# Patient Record
Sex: Male | Born: 1970 | Race: White | Hispanic: No | Marital: Single | Smoking: Current every day smoker
Health system: Southern US, Community
[De-identification: ages and names within clinical notes are randomized; demographics above are authoritative.]

## PROBLEM LIST (undated history)

## (undated) DIAGNOSIS — B1920 Unspecified viral hepatitis C without hepatic coma: Secondary | ICD-10-CM

---

## 2020-02-13 ENCOUNTER — Other Ambulatory Visit: Payer: Self-pay

## 2020-02-13 ENCOUNTER — Emergency Department (HOSPITAL_COMMUNITY)
Admission: EM | Admit: 2020-02-13 | Discharge: 2020-02-13 | Disposition: A | Discharge status: Home / Self Care | Payer: Self-pay | Attending: Emergency Medicine | Admitting: Emergency Medicine

## 2020-02-13 ENCOUNTER — Emergency Department (HOSPITAL_COMMUNITY): Payer: Self-pay

## 2020-02-13 ENCOUNTER — Encounter (HOSPITAL_COMMUNITY): Payer: Self-pay

## 2020-02-13 DIAGNOSIS — F17210 Nicotine dependence, cigarettes, uncomplicated: Secondary | ICD-10-CM | POA: Insufficient documentation

## 2020-02-13 DIAGNOSIS — L03114 Cellulitis of left upper limb: Secondary | ICD-10-CM | POA: Insufficient documentation

## 2020-02-13 DIAGNOSIS — M7989 Other specified soft tissue disorders: Secondary | ICD-10-CM

## 2020-02-13 DIAGNOSIS — R238 Other skin changes: Secondary | ICD-10-CM

## 2020-02-13 HISTORY — DX: Unspecified viral hepatitis C without hepatic coma: B19.20

## 2020-02-13 LAB — LACTIC ACID, PLASMA
Lactic Acid, Venous: 1.3 mmol/L (ref 0.5–1.9)
Lactic Acid, Venous: 1.1 mmol/L (ref 0.5–1.9)

## 2020-02-13 LAB — CBC WITH DIFFERENTIAL/PLATELET
Abs Immature Granulocytes: 0.07 10*3/uL (ref 0.00–0.07)
Basophils Absolute: 0.1 10*3/uL (ref 0.0–0.1)
Basophils Relative: 1 %
Eosinophils Absolute: 0.3 10*3/uL (ref 0.0–0.5)
Eosinophils Relative: 3 %
HCT: 37.6 % — ABNORMAL LOW (ref 39.0–52.0)
Hemoglobin: 12.5 g/dL — ABNORMAL LOW (ref 13.0–17.0)
Immature Granulocytes: 1 %
Lymphocytes Relative: 18 %
Lymphs Abs: 1.8 10*3/uL (ref 0.7–4.0)
MCH: 29.9 pg (ref 26.0–34.0)
MCHC: 33.2 g/dL (ref 30.0–36.0)
MCV: 90 fL (ref 80.0–100.0)
Monocytes Absolute: 1.1 10*3/uL — ABNORMAL HIGH (ref 0.1–1.0)
Monocytes Relative: 11 %
Neutro Abs: 6.8 10*3/uL (ref 1.7–7.7)
Neutrophils Relative %: 66 %
Platelets: 400 10*3/uL (ref 150–400)
RBC: 4.18 MIL/uL — ABNORMAL LOW (ref 4.22–5.81)
RDW: 13.5 % (ref 11.5–15.5)
WBC: 10.1 10*3/uL (ref 4.0–10.5)
nRBC: 0 % (ref 0.0–0.2)

## 2020-02-13 LAB — BASIC METABOLIC PANEL
Anion gap: 8 (ref 5–15)
BUN: 20 mg/dL (ref 6–20)
CO2: 24 mmol/L (ref 22–32)
Calcium: 8.9 mg/dL (ref 8.9–10.3)
Chloride: 103 mmol/L (ref 98–111)
Creatinine, Ser: 1.04 mg/dL (ref 0.61–1.24)
GFR, Estimated: >60 mL/min (ref >60)
Glucose, Bld: 106 mg/dL — ABNORMAL HIGH (ref 70–99)
Potassium: 4 mmol/L (ref 3.5–5.1)
Sodium: 135 mmol/L (ref 135–145)

## 2020-02-13 MED ORDER — IBUPROFEN 800 MG PO TABS
800.0000 mg | ORAL_TABLET | Freq: Once | ORAL | Status: AC
  Administered 2020-02-13: 800 mg via ORAL
  Filled 2020-02-13: qty 1

## 2020-02-13 MED ORDER — SULFAMETHOXAZOLE-TRIMETHOPRIM 800-160 MG PO TABS: 1.0000 | ORAL_TABLET | Freq: Two times a day (BID) | ORAL | 0 refills | Status: AC

## 2020-02-13 MED ORDER — SODIUM CHLORIDE 0.9 % IV BOLUS
500.0000 mL | Freq: Once | INTRAVENOUS | Status: AC
  Administered 2020-02-13: 500 mL via INTRAVENOUS

## 2020-02-13 NOTE — ED Provider Notes (Signed)
[facility] EMERGENCY DEPARTMENT Provider Note   CSN: [id number] Arrival date & time: 02/13/20  8916     History Chief Complaint  Patient presents with  . Skin Problem    Michael Copeland is a 49 y.o. male.  HPI   49 year old male with past medical history of Hep C in remission presents with LUE swelling and redness. Patient states over two months ago he had an injury and break in the skin on the left forearm from his dogs leash. He had been applying neosporin, but found a "discolored clump" in the tube that he believes has caused a fungal infection on his arm. Patient has been trying multiple different topical remedies daily with worsening condition. Over the last week he states the swelling and redness has become worse and it is more painful with raw skin. Denies any other injury to the arm, no numbness. Denies fever, CP, SOB, n/v/d or other acute illness. He does mention swelling in his BLE, left worse than right. He is a [employer] and sits for multiple hours daily. No history of DVT/PE.  Past Medical History:  Diagnosis Date  . Hepatitis C     There are no problems to display for this patient.   History reviewed. No pertinent surgical history.     History reviewed. No pertinent family history.  Social History   Tobacco Use  . Smoking status: Current Every Day Smoker    Packs/day: 0.50    Types: Cigarettes  . Smokeless tobacco: Never Used  Substance Use Topics  . Alcohol use: Not Currently  . Drug use: Not Currently    Home Medications Prior to Admission medications   Not on File    Allergies    Patient has no known allergies.  Review of Systems   Review of Systems  Constitutional: Positive for fatigue. Negative for chills and fever.  Eyes: Negative for visual disturbance.  Respiratory: Negative for cough and shortness of breath.   Cardiovascular: Negative for chest pain.  Gastrointestinal: Negative for abdominal distention, abdominal pain, diarrhea,  nausea and vomiting.  Genitourinary: Negative for dysuria.  Musculoskeletal:       +left forearm swelling and wounds, +BLE edema left worse than right  Skin: Positive for color change, rash and wound.  Neurological: Negative for headaches.    Physical Exam Updated Vital Signs BP 136/85   Pulse (!) 107   Temp 98.5 F (36.9 C)   Resp 18   Ht 6\' 2"  (1.88 m)   Wt 106.6 kg   SpO2 97%   BMI 30.17 kg/m   Physical Exam Vitals and nursing note reviewed.  Constitutional:      General: He is not in acute distress. HENT:     Head: Normocephalic.  Eyes:     General: No scleral icterus. Cardiovascular:     Rate and Rhythm: Tachycardia present.  Pulmonary:     Effort: Pulmonary effort is normal. No respiratory distress.     Breath sounds: Normal breath sounds.  Abdominal:     Palpations: Abdomen is soft.     Tenderness: There is no abdominal tenderness.  Musculoskeletal:     Right lower leg: Edema present.     Left lower leg: Edema present.     Comments: Left forearm is circumferentially swollen, erythematous with areas of raw open skin and clear weeping, hand is edematous, +radial pulse and brisk capillary refill, fist making hindered by swelling but no noted weakness or deficit. Upper arm and bicep  do not appear swollen, normal ROM of shoulder and no axillary tenderness  Skin:    General: Skin is warm.  Neurological:     Mental Status: He is alert and oriented to person, place, and time. Mental status is at baseline.  Psychiatric:        Mood and Affect: Mood normal.     ED Results / Procedures / Treatments   Labs (all labs ordered are listed, but only abnormal results are displayed) Labs Reviewed  CBC WITH DIFFERENTIAL/PLATELET  BASIC METABOLIC PANEL  LACTIC ACID, PLASMA  LACTIC ACID, PLASMA    EKG None  Radiology No results found.  Procedures Procedures (including critical care time)  Medications Ordered in ED Medications  sodium chloride 0.9 % bolus 500 mL  (has no administration in time range)  ibuprofen (ADVIL) tablet 800 mg (has no administration in time range)    ED Course  I have reviewed the triage vital signs and the nursing notes.  Pertinent labs & imaging results that were available during my care of the patient were reviewed by me and considered in my medical decision making (see chart for details).  Clinical Course as of Feb 13 1120  Thu Feb 13, 2020  7425 EKG is NSR, normal intervals and no acute ischemic changes. Blood work shows no significant leukocytosis or shift, lactic is normal, chemistry unremarkable.    [KH]  1120 US reveal no DVT in upper arm or BLE   [KH]    Clinical Course User Index [KH] Antionette Luster, Clabe Seal, DO   MDM Rules/Calculators/A&P                          Patient tachycardic on arrival, low 100s. No CP or SOB, afebrile. Sitting up and non toxic appearing. Left forearm is red, edematous with open irritated skin, concerning for infection. BLE swollen with 2+ pitting edema, will [place] to r/o DVT given LUE PE and h/o long periods of sitting. Blood work pending.  Blood work is reassuring, no signs of sepsis, [place] r/o DVT in extremities. HR and VS normal, patient resting comfortably. Plan to DC with oral antibiotics and wound care instructions. Also discussed not placing any further oils/lotions or topicals on the arm. Patient verbalized understanding and agrees with DC. Final Clinical Impression(s) / ED Diagnoses Final diagnoses:  None    Rx / DC Orders ED Discharge Orders    None       Rozelle Logan, DO 02/13/20 1121

## 2020-02-13 NOTE — ED Triage Notes (Signed)
Pt presents to ED with complaints of left arm swelling, dry and redness. Pt c/o pain to that arm, states been going on for months. Pt states he has been using various topical ointments, Lamisil, tea tree oil, lavender etc.

## 2020-02-13 NOTE — Discharge Instructions (Addendum)
You have been prescribed antibiotics for left arm infection. Take these as directed. Keep the arm clean and wrapped for the next 24 hours. Then keep the arm clean and dry. You may shower, pat the arm to dry. DO NOT place any more topical creams/lotions on the arm. If you have worsening symptoms, the redness or swelling spreads, high fevers, further concerns for your health please return to an ER for evaluation promptly.

## 2021-11-25 IMAGING — US US EXTREM LOW VENOUS
1 series · 14 of 24 positions shown · non-contrast
Comparison: None.

CLINICAL DATA: Redness and swelling of the lower extremities.
Symptoms for 2 months.

EXAM:
BILATERAL LOWER EXTREMITY VENOUS DOPPLER ULTRASOUND
TECHNIQUE: Gray-scale sonography with compression, as well as color and duplex
ultrasound, were performed to evaluate the deep venous system(s)
from the level of the common femoral vein through the popliteal and
proximal calf veins.

[Series 1: us venous img lower bilat (dvt) · portal-venous · 14 of 61 slices shown]
[im 1/61]
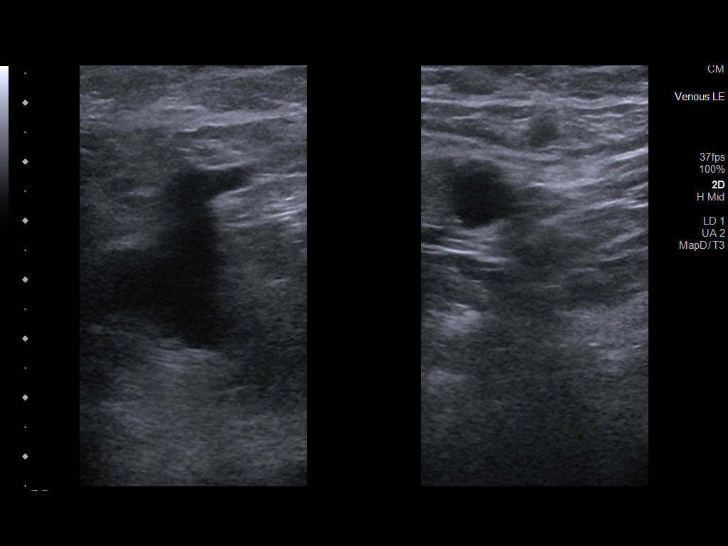
[im 6/61]
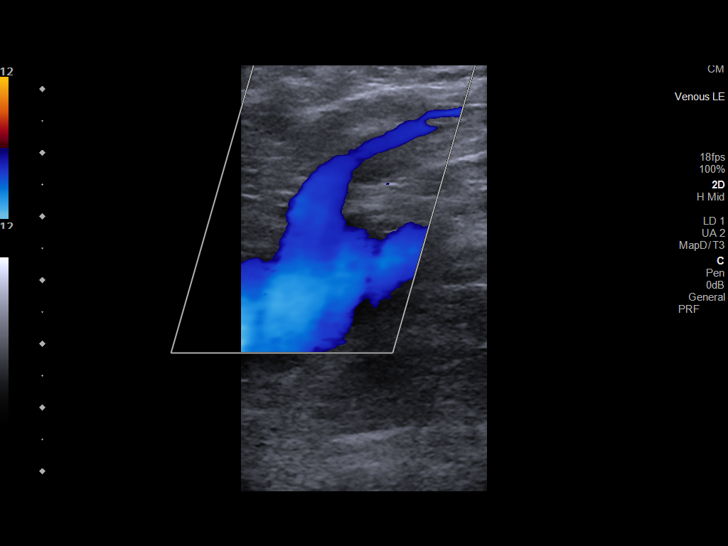
[im 11/61]
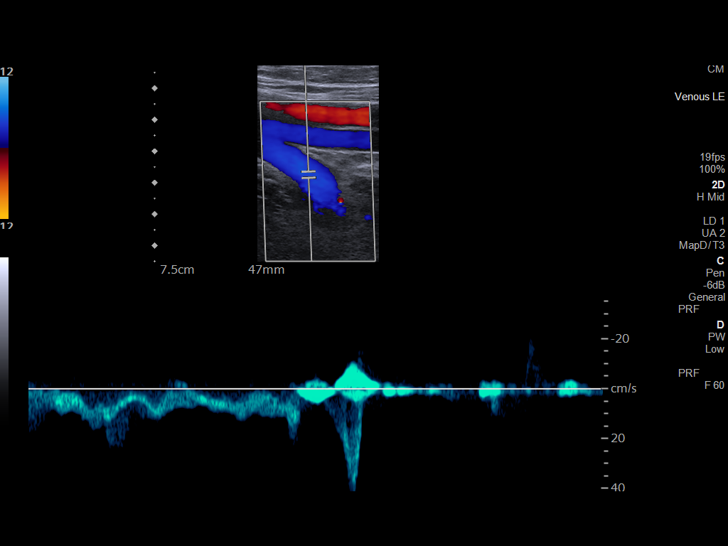
[im 16/61]
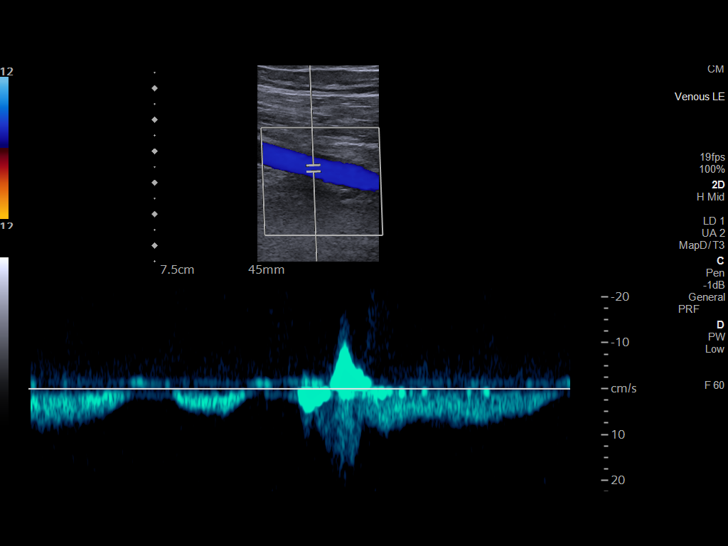
[im 19/61]
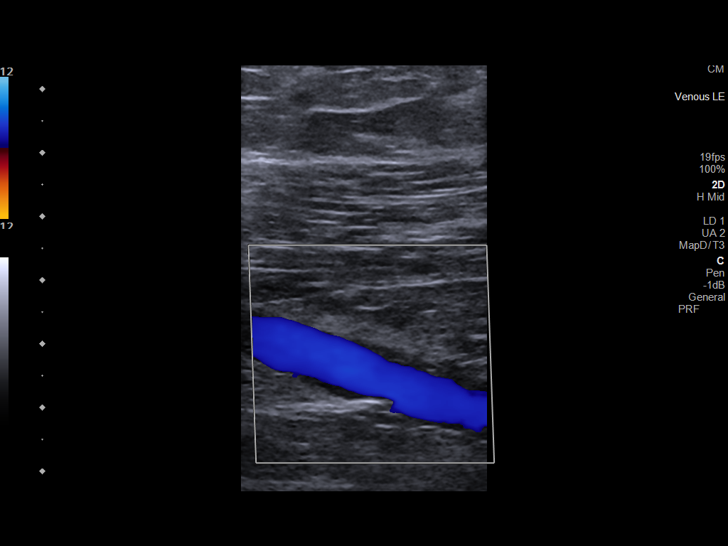
[im 24/61]
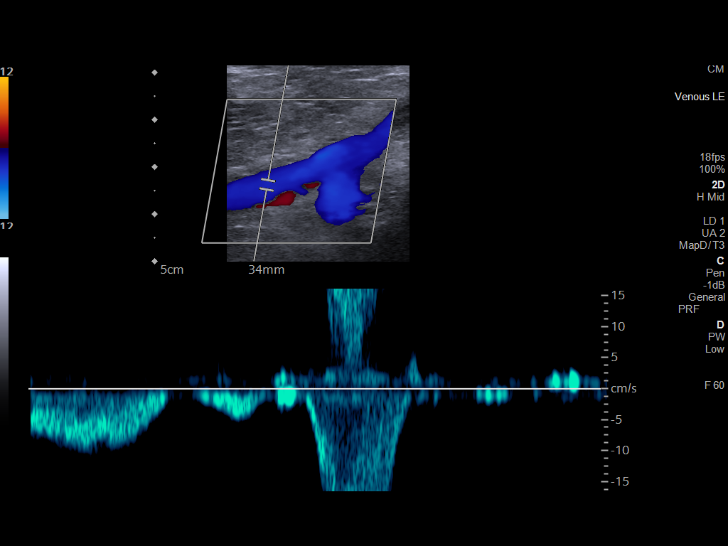
[im 29/61]
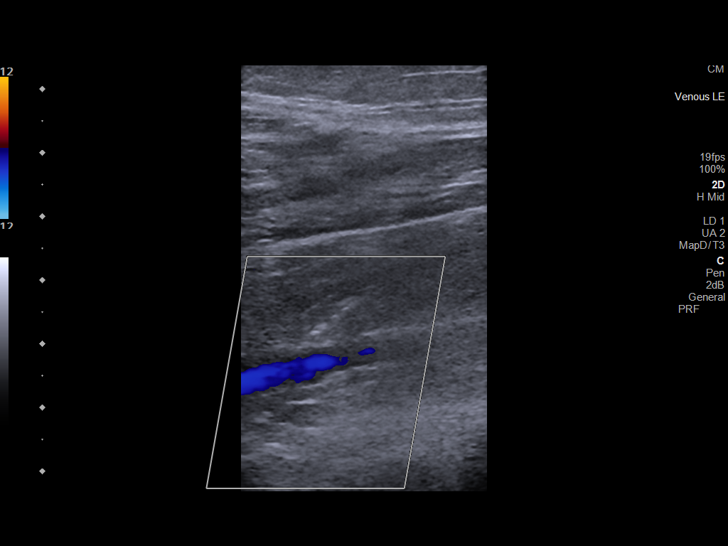
[im 32/61]
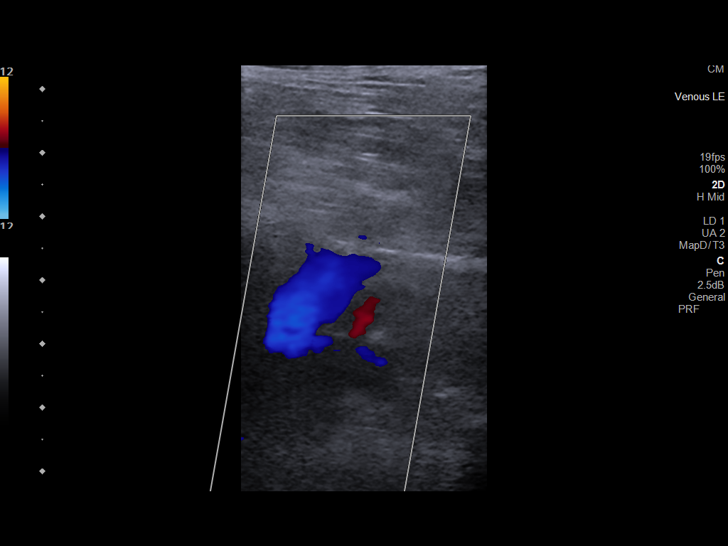
[im 37/61]
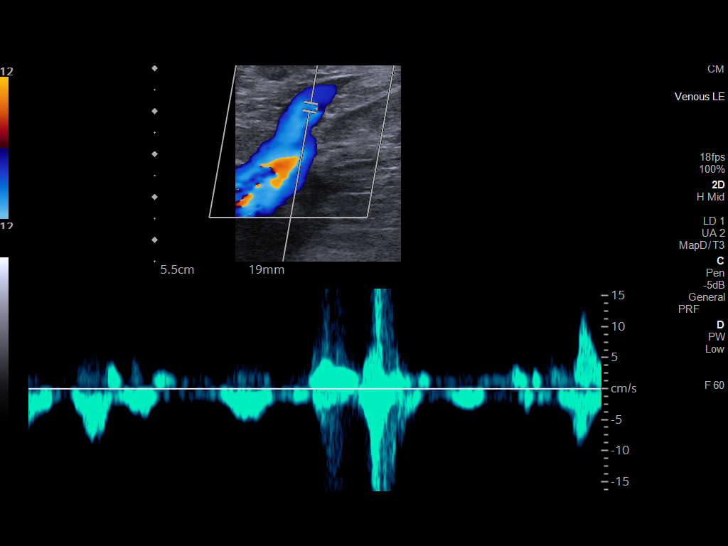
[im 42/61]
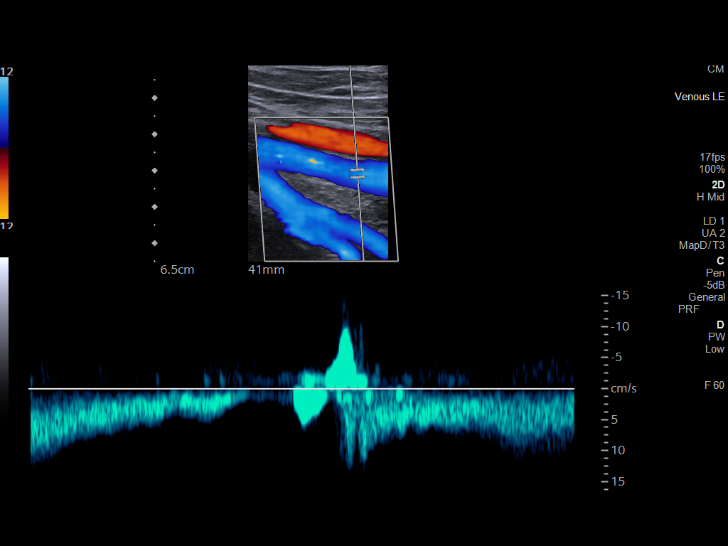
[im 47/61]
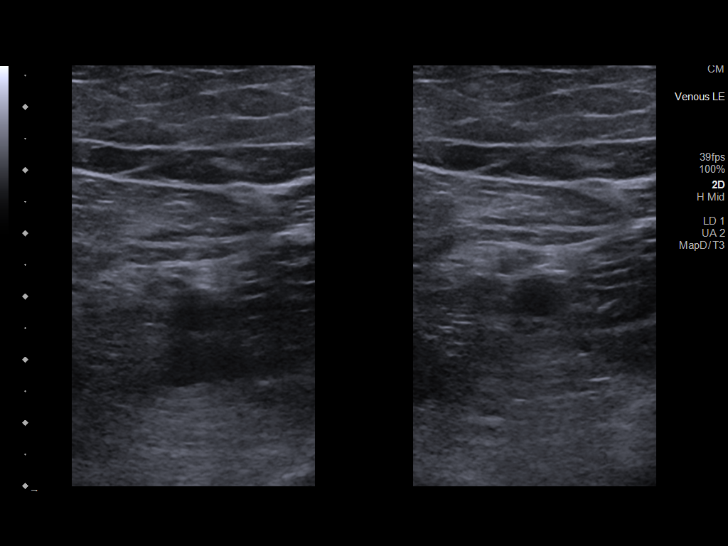
[im 50/61]
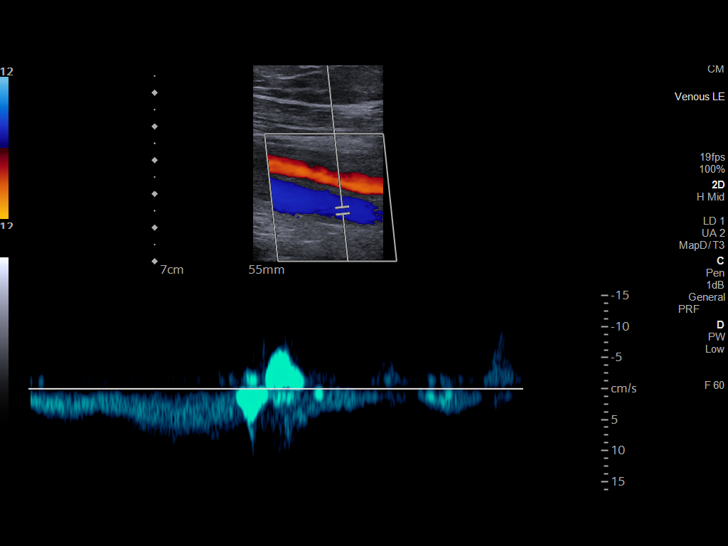
[im 55/61]
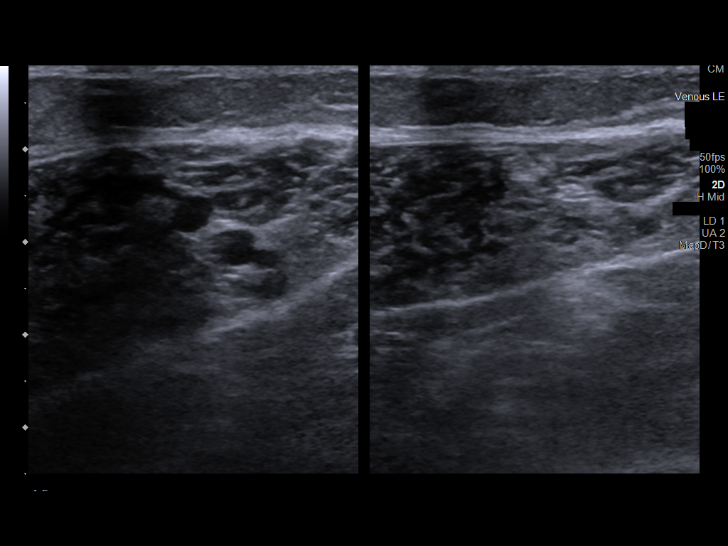
[im 61/61]
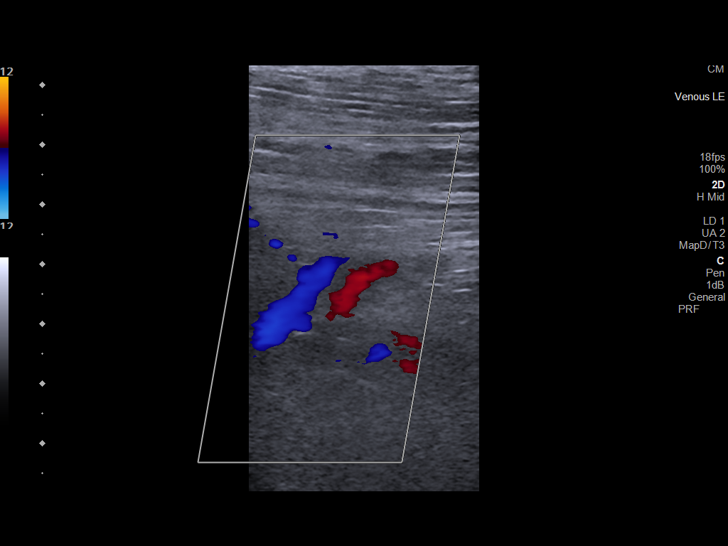

[14 of 24 positions shown; findings below may reference images not displayed]

FINDINGS: VENOUS

Normal compressibility of the common femoral, superficial femoral,
and popliteal veins, as well as the visualized calf veins.
Visualized portions of profunda femoral vein and great saphenous
vein unremarkable. No filling defects to suggest DVT on grayscale or
color Doppler imaging. Doppler waveforms show normal direction of
venous flow, normal respiratory plasticity and response to
augmentation.

Limited views of the contralateral common femoral vein are
unremarkable.

OTHER

None.

Limitations: none
IMPRESSION: Negative.

## 2021-11-25 IMAGING — US US EXTREM  UP VENOUS*L*
1 series · 13 of 24 positions shown · non-contrast
Comparison: None.

CLINICAL DATA: Left upper extremity pain and edema. History of
smoking. Evaluate for DVT.



[Series 1: us venous img upper uni left (dvt) · portal-venous · 13 of 45 slices shown]
[im 1/45]
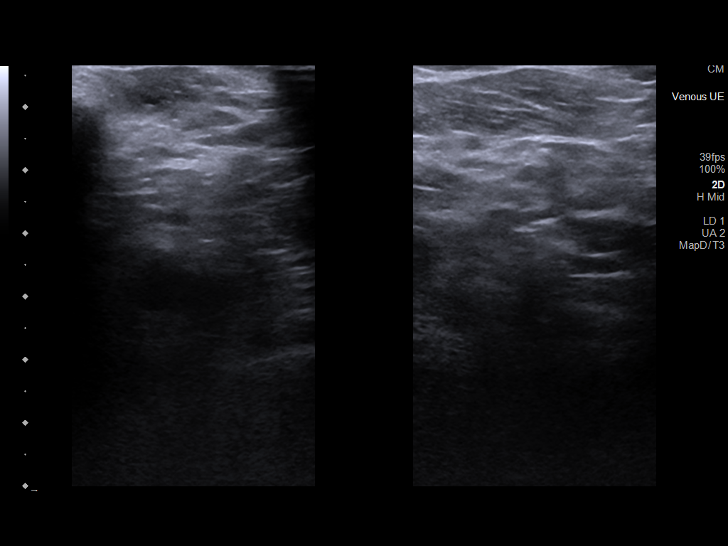
[im 4/45]
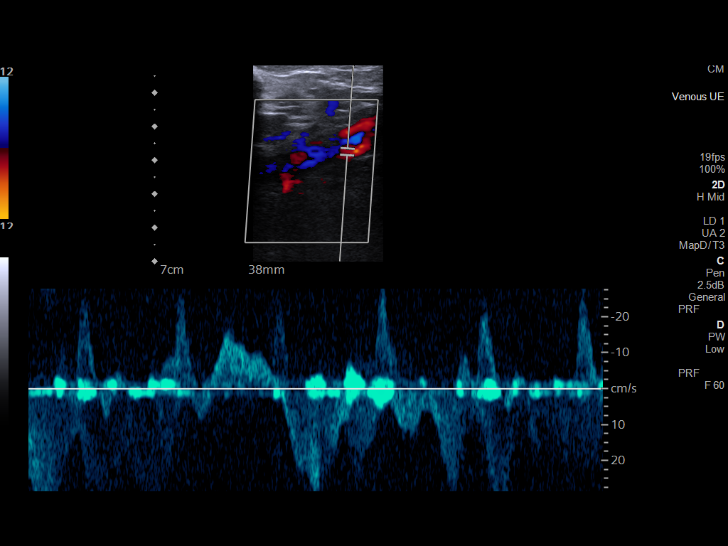
[im 8/45]
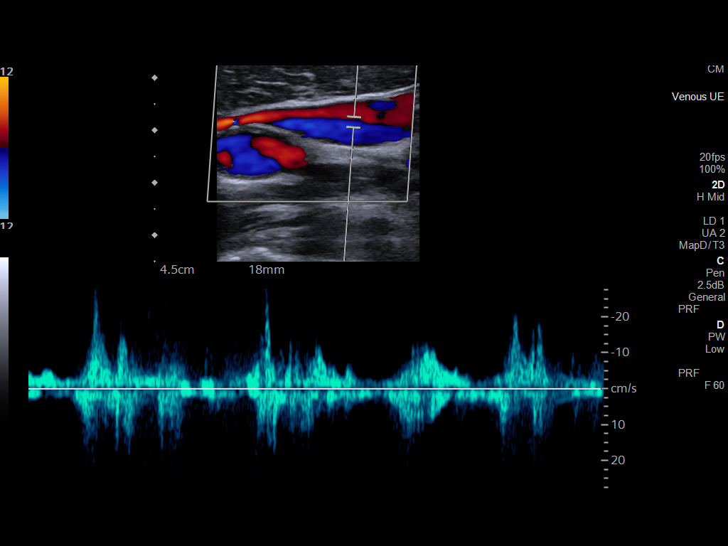
[im 12/45]
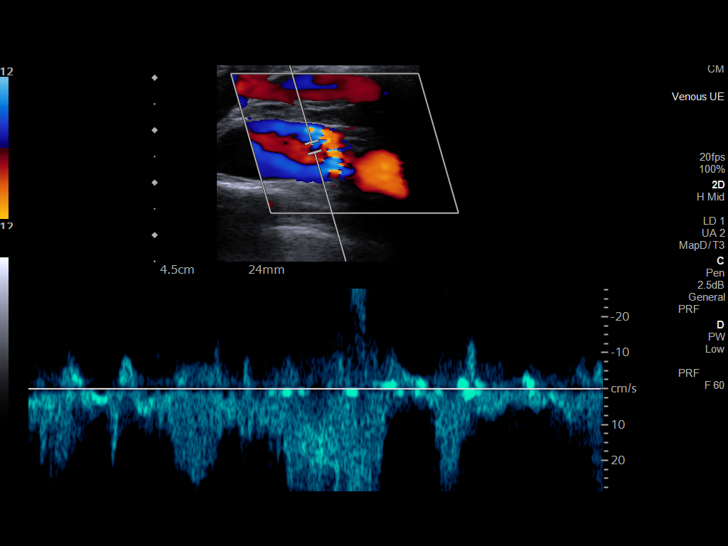
[im 16/45]
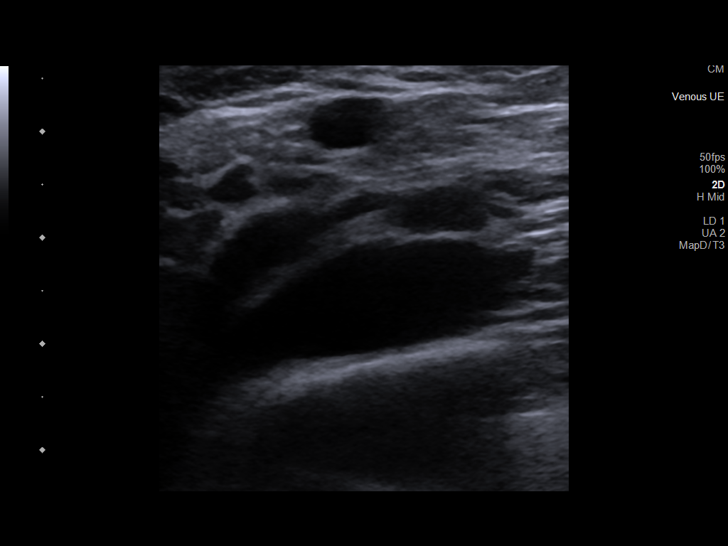
[im 20/45]
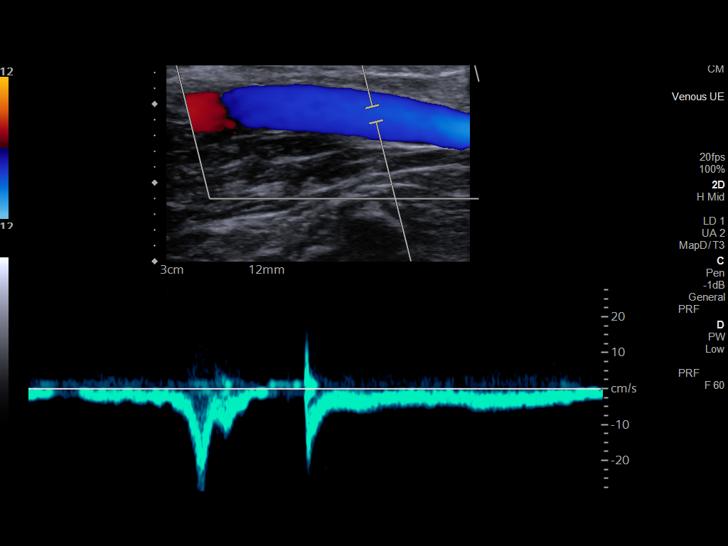
[im 23/45]
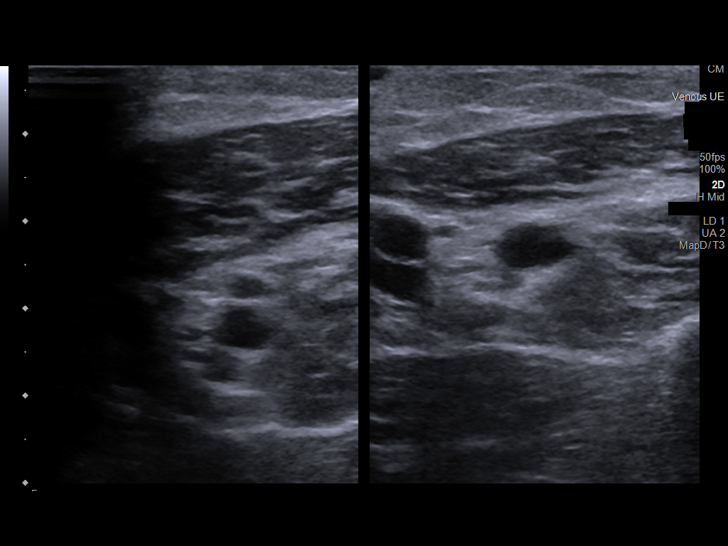
[im 25/45]
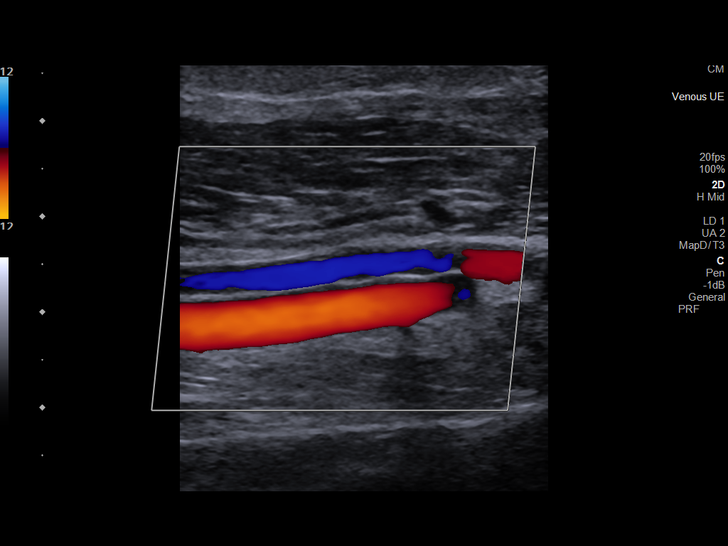
[im 29/45]
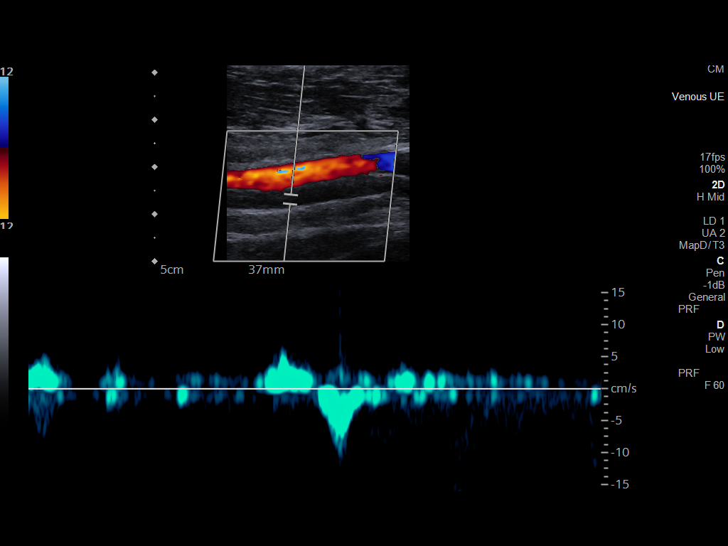
[im 33/45]
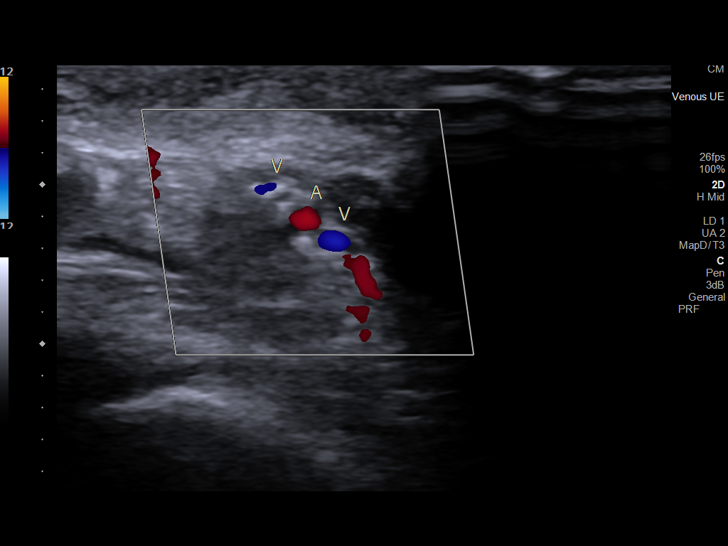
[im 37/45]
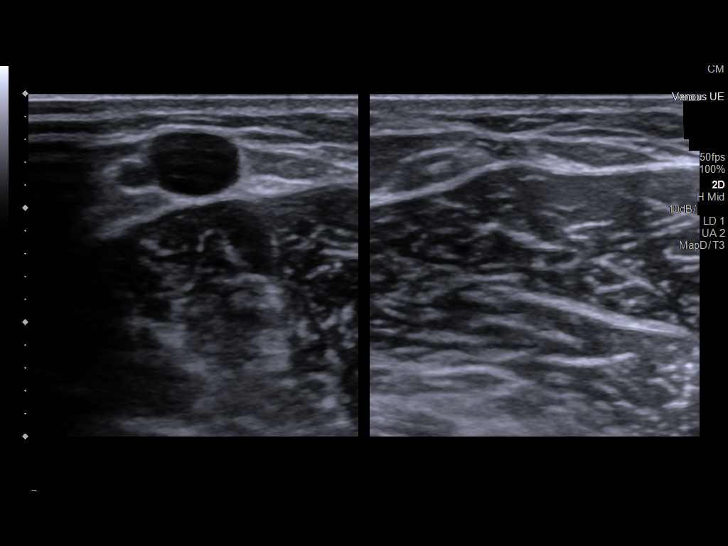
[im 41/45]
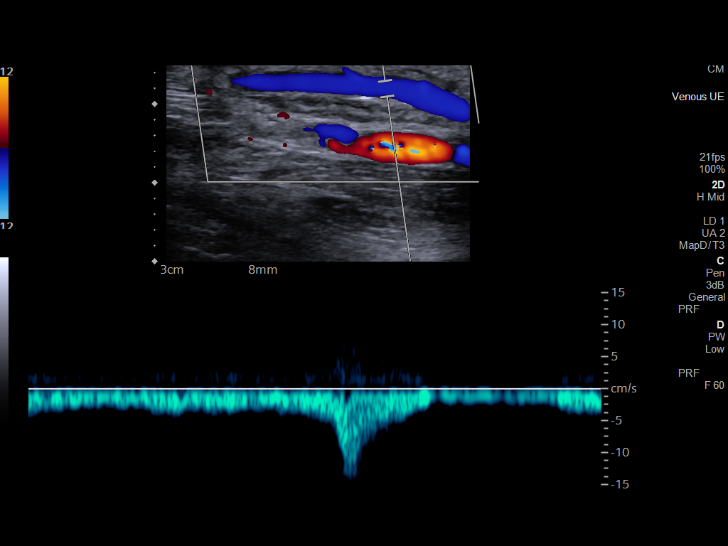
[im 45/45]
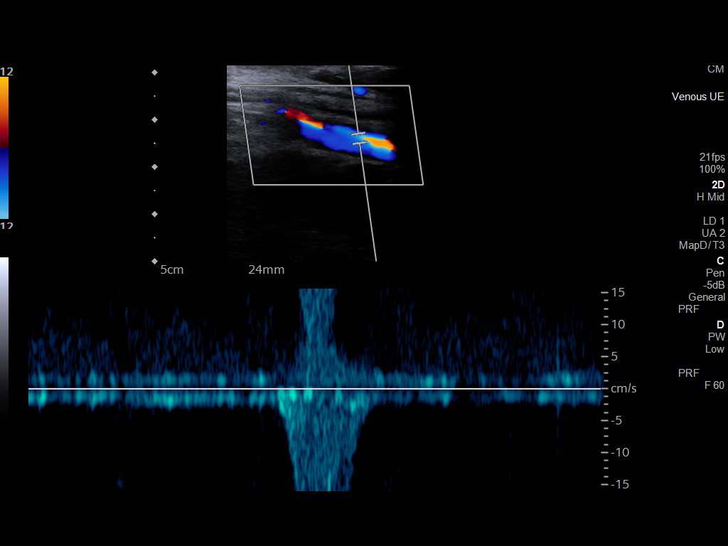

[13 of 24 positions shown; findings below may reference images not displayed]

FINDINGS: Contralateral Subclavian Vein: Respiratory phasicity is normal and
symmetric with the symptomatic side. No evidence of thrombus. Normal
compressibility.

Internal Jugular Vein: No evidence of thrombus. Normal
compressibility, respiratory phasicity and response to augmentation.

Subclavian Vein: No evidence of thrombus. Normal compressibility,
respiratory phasicity and response to augmentation.

Axillary Vein: No evidence of thrombus. Normal compressibility,
respiratory phasicity and response to augmentation.

Cephalic Vein: No evidence of thrombus. Normal compressibility,
respiratory phasicity and response to augmentation.

Basilic Vein: No evidence of thrombus. Normal compressibility,
respiratory phasicity and response to augmentation.

Brachial Veins: No evidence of thrombus. Normal compressibility,
respiratory phasicity and response to augmentation.

Radial Veins: No evidence of thrombus. Normal compressibility,
respiratory phasicity and response to augmentation.

Ulnar Veins: No evidence of thrombus. Normal compressibility,
respiratory phasicity and response to augmentation.

Venous Reflux:  None visualized.

Other Findings:  None visualized.
IMPRESSION: No evidence of DVT within the left upper extremity.
